# Patient Record
Sex: Female | Born: 1940 | Race: White | Hispanic: No | Marital: Single | Smoking: Never smoker
Health system: Southern US, Community
[De-identification: ages and names within clinical notes are randomized; demographics above are authoritative.]

## PROBLEM LIST (undated history)

## (undated) HISTORY — PX: ABDOMINAL HYSTERECTOMY: SHX81

---

## 2018-09-15 ENCOUNTER — Ambulatory Visit (INDEPENDENT_AMBULATORY_CARE_PROVIDER_SITE_OTHER): Payer: Medicare Other

## 2018-09-15 ENCOUNTER — Encounter (HOSPITAL_COMMUNITY): Payer: Self-pay | Admitting: Emergency Medicine

## 2018-09-15 ENCOUNTER — Ambulatory Visit (HOSPITAL_COMMUNITY)
Admission: EM | Admit: 2018-09-15 | Discharge: 2018-09-15 | Disposition: A | Discharge status: Home / Self Care | Payer: Medicare Other | Attending: Family Medicine | Admitting: Family Medicine

## 2018-09-15 DIAGNOSIS — S199XXA Unspecified injury of neck, initial encounter: Secondary | ICD-10-CM | POA: Diagnosis not present

## 2018-09-15 DIAGNOSIS — S161XXA Strain of muscle, fascia and tendon at neck level, initial encounter: Secondary | ICD-10-CM | POA: Diagnosis not present

## 2018-09-15 DIAGNOSIS — M542 Cervicalgia: Secondary | ICD-10-CM | POA: Diagnosis not present

## 2018-09-15 MED ORDER — PREDNISONE 20 MG PO TABS: ORAL_TABLET | ORAL | 0 refills | Status: AC
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
## 2023-04-29 ENCOUNTER — Emergency Department (HOSPITAL_COMMUNITY)
Admission: EM | Admit: 2023-04-29 | Discharge: 2023-04-29 | Disposition: A | Discharge status: Home / Self Care | Payer: Medicare Other | Attending: Emergency Medicine | Admitting: Emergency Medicine

## 2023-04-29 ENCOUNTER — Emergency Department (HOSPITAL_COMMUNITY): Payer: Medicare Other

## 2023-04-29 ENCOUNTER — Other Ambulatory Visit: Payer: Self-pay

## 2023-04-29 ENCOUNTER — Encounter (HOSPITAL_COMMUNITY): Payer: Self-pay

## 2023-04-29 DIAGNOSIS — R000 Tachycardia, unspecified: Secondary | ICD-10-CM | POA: Diagnosis not present

## 2023-04-29 DIAGNOSIS — I10 Essential (primary) hypertension: Secondary | ICD-10-CM | POA: Insufficient documentation

## 2023-04-29 DIAGNOSIS — R42 Dizziness and giddiness: Secondary | ICD-10-CM | POA: Diagnosis present

## 2023-04-29 LAB — CBC WITH DIFFERENTIAL/PLATELET
Abs Immature Granulocytes: 0.02 10*3/uL (ref 0.00–0.07)
Basophils Absolute: 0 10*3/uL (ref 0.0–0.1)
Basophils Relative: 1 %
Eosinophils Absolute: 0.4 10*3/uL (ref 0.0–0.5)
Eosinophils Relative: 6 %
HCT: 44.6 % (ref 36.0–46.0)
Hemoglobin: 14.4 g/dL (ref 12.0–15.0)
Immature Granulocytes: 0 %
Lymphocytes Relative: 26 %
Lymphs Abs: 1.7 10*3/uL (ref 0.7–4.0)
MCH: 29.8 pg (ref 26.0–34.0)
MCHC: 32.3 g/dL (ref 30.0–36.0)
MCV: 92.1 fL (ref 80.0–100.0)
Monocytes Absolute: 0.7 10*3/uL (ref 0.1–1.0)
Monocytes Relative: 10 %
Neutro Abs: 3.7 10*3/uL (ref 1.7–7.7)
Neutrophils Relative %: 57 %
Platelets: 266 10*3/uL (ref 150–400)
RBC: 4.84 MIL/uL (ref 3.87–5.11)
RDW: 13.4 % (ref 11.5–15.5)
WBC: 6.6 10*3/uL (ref 4.0–10.5)
nRBC: 0 % (ref 0.0–0.2)

## 2023-04-29 LAB — COMPREHENSIVE METABOLIC PANEL
ALT: 29 U/L (ref 0–44)
AST: 25 U/L (ref 15–41)
Albumin: 4.1 g/dL (ref 3.5–5.0)
Alkaline Phosphatase: 61 U/L (ref 38–126)
Anion gap: 12 (ref 5–15)
BUN: 17 mg/dL (ref 8–23)
CO2: 25 mmol/L (ref 22–32)
Calcium: 9.4 mg/dL (ref 8.9–10.3)
Chloride: 101 mmol/L (ref 98–111)
Creatinine, Ser: 0.7 mg/dL (ref 0.44–1.00)
GFR, Estimated: >60 mL/min (ref >60)
Glucose, Bld: 144 mg/dL — ABNORMAL HIGH (ref 70–99)
Potassium: 3.6 mmol/L (ref 3.5–5.1)
Sodium: 138 mmol/L (ref 135–145)
Total Bilirubin: 0.6 mg/dL (ref 0.3–1.2)
Total Protein: 7.8 g/dL (ref 6.5–8.1)

## 2023-04-29 LAB — URINALYSIS, ROUTINE W REFLEX MICROSCOPIC
Bacteria, UA: NONE SEEN
Bilirubin Urine: NEGATIVE
Glucose, UA: NEGATIVE mg/dL
Hgb urine dipstick: NEGATIVE
Ketones, ur: 5 mg/dL — AB
Nitrite: NEGATIVE
Protein, ur: NEGATIVE mg/dL
Specific Gravity, Urine: 1.008 (ref 1.005–1.030)
pH: 8 (ref 5.0–8.0)

## 2023-04-29 LAB — TROPONIN I (HIGH SENSITIVITY)
Troponin I (High Sensitivity): 6 ng/L (ref <18)
Troponin I (High Sensitivity): 7 ng/L (ref <18)

## 2023-04-29 LAB — LIPASE, BLOOD: Lipase: 26 U/L (ref 11–51)

## 2023-04-29 MED ORDER — MECLIZINE HCL 25 MG PO TABS
25.0000 mg | ORAL_TABLET | Freq: Once | ORAL | Status: AC
  Administered 2023-04-29: 25 mg via ORAL
  Filled 2023-04-29: qty 1

## 2023-04-29 MED ORDER — SODIUM CHLORIDE 0.9 % IV BOLUS
500.0000 mL | Freq: Once | INTRAVENOUS | Status: AC
  Administered 2023-04-29: 500 mL via INTRAVENOUS

## 2023-04-29 MED ORDER — LABETALOL HCL 5 MG/ML IV SOLN
10.0000 mg | Freq: Once | INTRAVENOUS | Status: AC
  Administered 2023-04-29: 10 mg via INTRAVENOUS
  Filled 2023-04-29: qty 4

## 2023-04-29 MED ORDER — MECLIZINE HCL 25 MG PO TABS: 25.0000 mg | ORAL_TABLET | Freq: Three times a day (TID) | ORAL | 0 refills | Status: AC | PRN

## 2023-04-29 MED ORDER — ONDANSETRON HCL 4 MG/2ML IJ SOLN
4.0000 mg | Freq: Once | INTRAMUSCULAR | Status: AC
  Administered 2023-04-29: 4 mg via INTRAVENOUS
  Filled 2023-04-29: qty 2

## 2023-04-29 NOTE — Plan of Care (Signed)
[facility] Tonsillectomy/Adenoidectomy, Postoperative PEDS care plan entered in error.

## 2023-04-29 NOTE — ED Triage Notes (Signed)
Patient BIB EMS for sudden onset of nausea and vomiting this AM. Patient reports she may have used expired milk in her coffee this AM. Patient actively vomiting on arrival.

## 2023-04-29 NOTE — ED Notes (Signed)
Pt ambulatory to bathroom w/ HHA for safety. Noted gait unsteady

## 2023-04-29 NOTE — ED Provider Notes (Signed)
[place] [facility] AT [facility] Provider Note   CSN: [id number] Arrival date & time: 04/29/23  1051     History  Chief Complaint  Patient presents with   Emesis    Leslie Hardy is a 82 y.o. female.   Emesis    Patient presented to the ED for evaluation of acute onset of nausea and vomiting.  Patient states she had a few cups of coffee this morning.  She did have milk in it.  It tasted normal but she is not sure if that could be related to why she started vomiting.  Patient however states she started to feel dizzy and lightheaded.  She felt like she was spinning and then she began vomiting.  Patient continues to feel some fullness of her upper abdomen but is not actually having pain.  She does not have any chest pain.  She denies any trouble with her vision.  No focal numbness or weakness.  Home Medications Prior to Admission medications   Medication Sig Start Date End Date Taking? Authorizing Provider  predniSONE (DELTASONE) 20 MG tablet One with breakfast daily 09/15/18   Elvina Sidle, MD      Allergies    Patient has no known allergies.    Review of Systems   Review of Systems  Gastrointestinal:  Positive for vomiting.    Physical Exam Updated Vital Signs BP (!) 190/96   Pulse 60   Temp 98.6 F (37 C) (Oral)   Resp 12   SpO2 96%  Physical Exam Vitals and nursing note reviewed.  Constitutional:      Appearance: She is well-developed. She is ill-appearing.  HENT:     Head: Normocephalic and atraumatic.     Right Ear: External ear normal.     Left Ear: External ear normal.  Eyes:     General: No visual field deficit or scleral icterus.       Right eye: No discharge.        Left eye: No discharge.     Conjunctiva/sclera: Conjunctivae normal.  Neck:     Trachea: No tracheal deviation.  Cardiovascular:     Rate and Rhythm: Regular rhythm. Tachycardia present.  Pulmonary:     Effort: Pulmonary effort is normal. No  respiratory distress.     Breath sounds: Normal breath sounds. No stridor. No wheezing or rales.  Abdominal:     General: Bowel sounds are normal. There is no distension.     Palpations: Abdomen is soft.     Tenderness: There is no abdominal tenderness. There is no guarding or rebound.  Musculoskeletal:        General: No tenderness.     Cervical back: Neck supple.  Skin:    General: Skin is warm and dry.     Findings: No rash.  Neurological:     Mental Status: She is alert and oriented to person, place, and time.     Cranial Nerves: No cranial nerve deficit, dysarthria or facial asymmetry.     Sensory: No sensory deficit.     Motor: No abnormal muscle tone, seizure activity or pronator drift.     Coordination: Coordination normal.     Comments:  able to hold both legs off bed for 5 seconds, sensation intact in all extremities,  no left or right sided neglect, normal finger-nose exam bilaterally, no nystagmus noted   Psychiatric:        Mood and Affect: Mood normal.  ED Results / Procedures / Treatments   Labs (all labs ordered are listed, but only abnormal results are displayed) Labs Reviewed  COMPREHENSIVE METABOLIC PANEL - Abnormal; Notable for the following components:      Result Value   Glucose, Bld 144 (*)    All other components within normal limits  URINALYSIS, ROUTINE W REFLEX MICROSCOPIC - Abnormal; Notable for the following components:   Color, Urine STRAW (*)    Ketones, ur 5 (*)    Leukocytes,Ua TRACE (*)    All other components within normal limits  LIPASE, BLOOD  CBC WITH DIFFERENTIAL/PLATELET  TROPONIN I (HIGH SENSITIVITY)  TROPONIN I (HIGH SENSITIVITY)    EKG None  Radiology DG Abdomen Acute W/Chest  Result Date: 04/29/2023 CLINICAL DATA:  Vomiting. EXAM: DG ABDOMEN ACUTE WITH 1 VIEW CHEST COMPARISON:  None Available. FINDINGS: The bowel gas pattern is non-obstructive. There is small-to-moderate stool burden, compatible with colonic  hypomotility. No evidence of pneumoperitoneum. Bilateral lung fields are clear. Note is made of elevated right hemidiaphragm. Bilateral costophrenic angles are clear. Normal cardio-mediastinal silhouette. No acute osseous abnormalities. The soft tissues are within normal limits. Surgical changes, devices, tubes and lines: None. IMPRESSION: 1. Nonobstructive bowel gas pattern. 2. No acute cardiopulmonary disease. 3. Elevated right hemidiaphragm. Electronically Signed   By: Jules Schick M.D.   On: 04/29/2023 14:31   CT Head Wo Contrast  Result Date: 04/29/2023 CLINICAL DATA:  Provided history: Neuro deficit, acute, stroke suspected. EXAM: CT HEAD WITHOUT CONTRAST TECHNIQUE: Contiguous axial images were obtained from the base of the skull through the vertex without intravenous contrast. RADIATION DOSE REDUCTION: This exam was performed according to the departmental dose-optimization program which includes automated exposure control, adjustment of the mA and/or kV according to patient size and/or use of iterative reconstruction technique. COMPARISON:  None. FINDINGS: Brain: Generalized cerebral atrophy. Patchy and ill-defined hypoattenuation within the cerebral white matter, nonspecific but compatible with mild chronic small vessel ischemic disease. There is no acute intracranial hemorrhage. No demarcated cortical infarct. No extra-axial fluid collection. No evidence of an intracranial mass. No midline shift. Vascular: No hyperdense vessel.  Atherosclerotic calcifications Skull: No calvarial fracture or aggressive osseous lesion. Sinuses/Orbits: No mass or acute finding within the imaged orbits. No significant paranasal sinus disease at the imaged levels. IMPRESSION: 1. No evidence of an acute intracranial abnormality. 2. Mild chronic small vessel ischemic changes within the cerebral white matter. 3. Generalized cerebral atrophy. Electronically Signed   By: Jackey Loge D.O.   On: 04/29/2023 14:16     Procedures Procedures    Medications Ordered in ED Medications  ondansetron (ZOFRAN) injection 4 mg (4 mg Intravenous Given 04/29/23 1221)  sodium chloride 0.9 % bolus 500 mL (0 mLs Intravenous Stopped 04/29/23 1411)  meclizine (ANTIVERT) tablet 25 mg (25 mg Oral Given 04/29/23 1223)  labetalol (NORMODYNE) injection 10 mg (10 mg Intravenous Given 04/29/23 1522)    ED Course/ Medical Decision Making/ A&P Clinical Course as of 04/29/23 1645  Fri Apr 29, 2023  1228 CBC normal.  Metabolic panel normal.  Troponin lipase normal [JK]  1519 Patient attempted to ambulate to the restroom.  She states she still felt dizzy and unsteady.  Patient notably hypertensive.  Reviewed.  Old records from an urgent care visit 4 years ago she had a systolic blood pressure of 200.  Patient states she does not take medications for blood pressure.  Concerned about the possibility of occult stroke we will proceed with MRI [JK]  Clinical Course User Index [JK] Linwood Dibbles, MD                                 Medical Decision Making Frontal diagnosis includes but not limited to stroke TIA vertigo anemia dehydration  Problems Addressed: Dizziness: acute illness or injury that poses a threat to life or bodily functions Uncontrolled hypertension: acute illness or injury that poses a threat to life or bodily functions  Amount and/or Complexity of Data Reviewed Labs: ordered. Radiology: ordered.  Risk Prescription drug management.   Patient presented to ED with complaints of acute nausea and dizziness.  No focal deficits noted on neuroexam however patient does have persistent unsteadiness with ambulation.  Laboratory test unremarkable.  No signs of anemia.  No electrolyte abnormalities.  Troponin is normal.  No findings to suggest cardiac ischemia.  Acute abdominal series does not show any signs of obstruction considering her nausea and vomiting.  Patient CT is not show any signs of hemorrhage or definite  acute stroke.  Patient continued to have some persistent dizziness.  She was unsteady on her feet.  Concerned about the possibility of occult stroke so we will proceed with MRI.  Patient also notably hypertensive in the ED.  I reviewed old records and back in 2020 patient was severely hypertensive.  Patient denies being on blood pressure medications.  She states she sees her primary care doctor but says that her primary doctor is in [place].  I suspect patient has chronic hypertension that is not being treated.   Care turned over to Dr Lockie Mola at shift change pending MRI        Final Clinical Impression(s) / ED Diagnoses Final diagnoses:  Dizziness  Uncontrolled hypertension    Rx / DC Orders ED Discharge Orders     None         Linwood Dibbles, MD 04/29/23 1645

## 2023-04-29 NOTE — ED Provider Notes (Addendum)
Signed out to me at 5 PM.  Patient awaiting MRI to rule out stroke.  Suspicion for vertigo.  She is feeling better after meclizine.  Lab works and unremarkable.  Will avoid MRI if unremarkable anticipate discharge home with meclizine.  MRI per unremarkable.  She is feeling much better.  Meclizine is in good condition.  This chart was dictated using voice recognition software.  Despite best efforts to proofread,  errors can occur which can change the documentation meaning.    Virgina Norfolk, DO 04/29/23 1803    Virgina Norfolk, DO 04/29/23 2233

## 2023-04-29 NOTE — Discharge Instructions (Signed)
Follow-up with your primary care doctor and your ear nose and throat doctor.  Return if symptoms worsen.
